# Patient Record
Sex: Female | Born: 1980 | Race: Black or African American | Hispanic: No | Marital: Single | Smoking: Never smoker
Health system: Southern US, Community
[De-identification: ages and names within clinical notes are randomized; demographics above are authoritative.]

## PROBLEM LIST (undated history)

## (undated) DIAGNOSIS — F419 Anxiety disorder, unspecified: Secondary | ICD-10-CM

## (undated) DIAGNOSIS — I10 Essential (primary) hypertension: Secondary | ICD-10-CM

---

## 2001-01-29 ENCOUNTER — Emergency Department (HOSPITAL_COMMUNITY): Admission: EM | Admit: 2001-01-29 | Discharge: 2001-01-29 | Payer: Self-pay | Admitting: *Deleted

## 2003-02-07 ENCOUNTER — Emergency Department (HOSPITAL_COMMUNITY): Admission: EM | Admit: 2003-02-07 | Discharge: 2003-02-07 | Payer: Self-pay | Admitting: Emergency Medicine

## 2003-02-08 ENCOUNTER — Emergency Department (HOSPITAL_COMMUNITY): Admission: EM | Admit: 2003-02-08 | Discharge: 2003-02-08 | Payer: Self-pay | Admitting: Emergency Medicine

## 2006-08-31 ENCOUNTER — Ambulatory Visit (HOSPITAL_COMMUNITY): Admission: RE | Admit: 2006-08-31 | Discharge: 2006-08-31 | Payer: Self-pay | Admitting: Obstetrics and Gynecology

## 2006-09-13 ENCOUNTER — Ambulatory Visit (HOSPITAL_COMMUNITY): Admission: RE | Admit: 2006-09-13 | Discharge: 2006-09-13 | Payer: Self-pay | Admitting: Gynecology

## 2007-02-01 ENCOUNTER — Encounter: Payer: Self-pay | Admitting: Obstetrics & Gynecology

## 2007-02-01 ENCOUNTER — Ambulatory Visit: Payer: Self-pay | Admitting: Obstetrics & Gynecology

## 2007-02-01 ENCOUNTER — Inpatient Hospital Stay (HOSPITAL_COMMUNITY): Admission: AD | Admit: 2007-02-01 | Discharge: 2007-02-04 | Payer: Self-pay | Admitting: Family Medicine

## 2008-12-06 IMAGING — US US OB FOLLOW-UP
1 series · 14 of 28 positions shown · non-contrast
Comparison: none

OBSTETRICAL ULTRASOUND:

 This ultrasound exam was performed in the [HOSPITAL] Ultrasound Department.  The OB US report was generated in the AS system, and faxed to the ordering physician.  This report is also available in [REDACTED] PACS.

[Series 1: us ob re-eval · 14 of 44 slices shown]
[im 2/44]
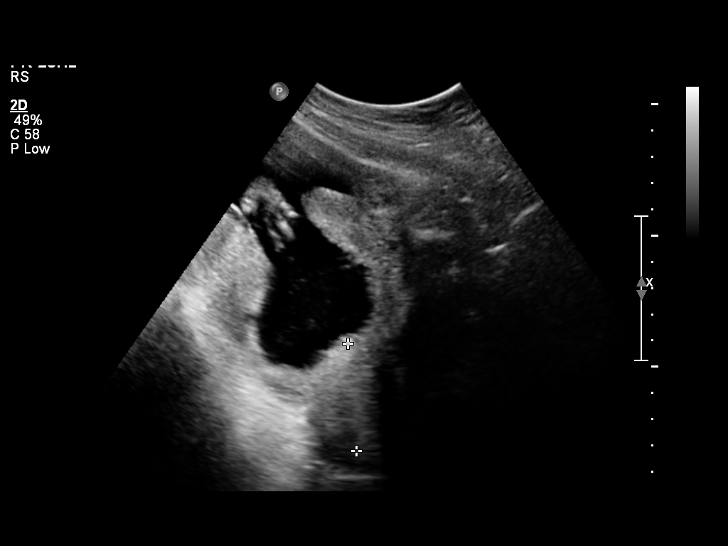
[im 5/44]
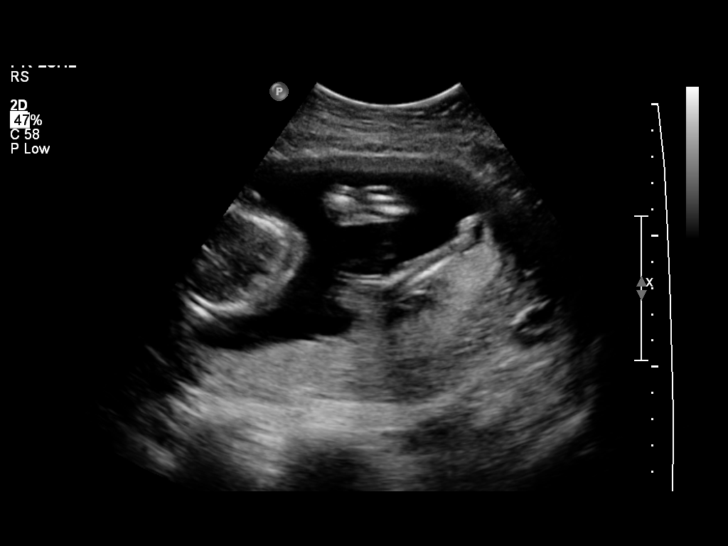
[im 8/44]
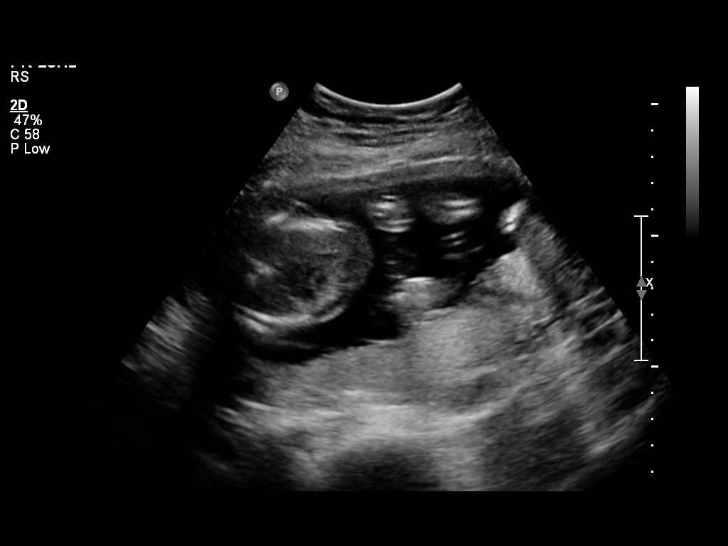
[im 12/44]
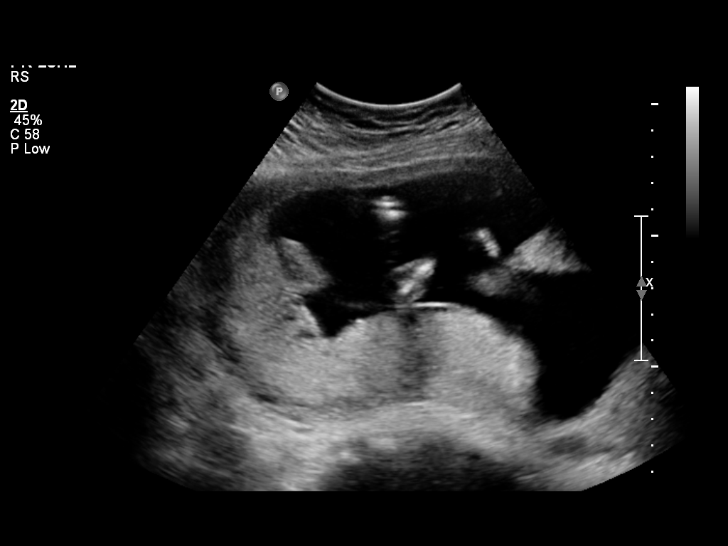
[im 15/44]
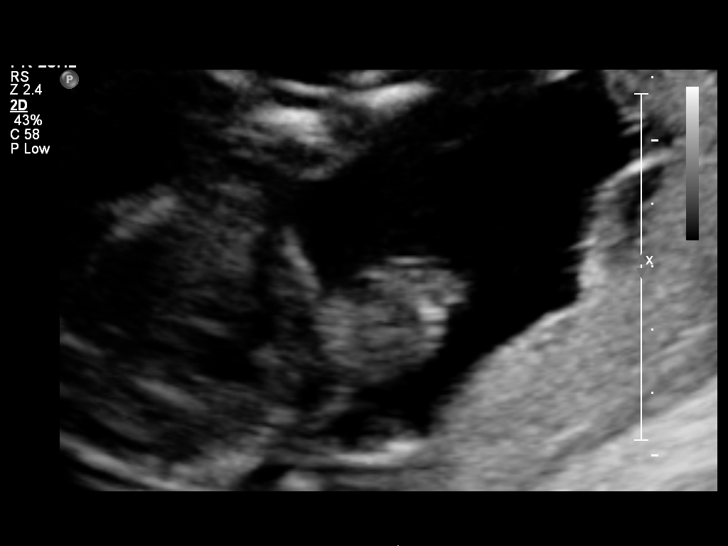
[im 18/44]
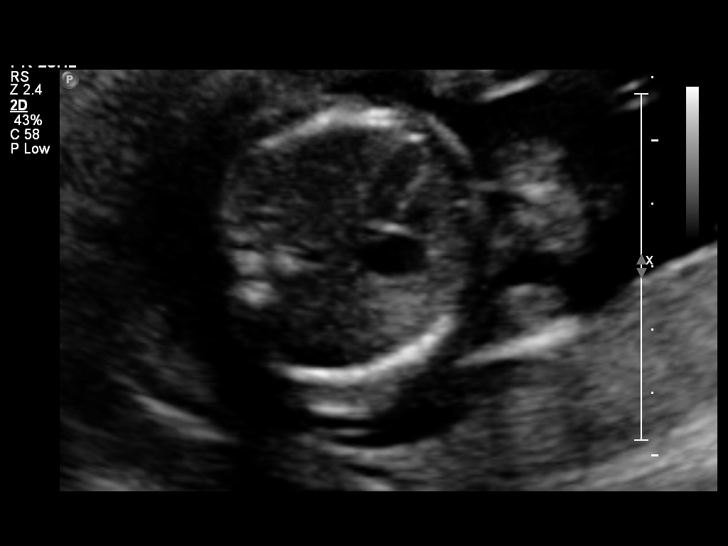
[im 21/44]
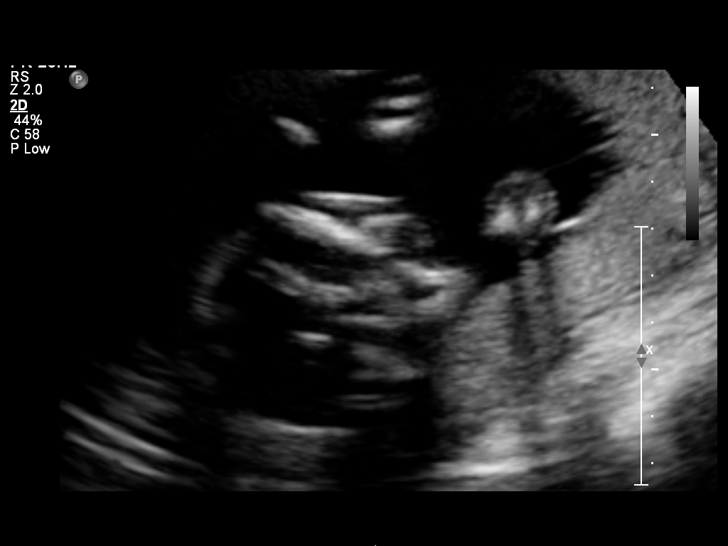
[im 24/44]
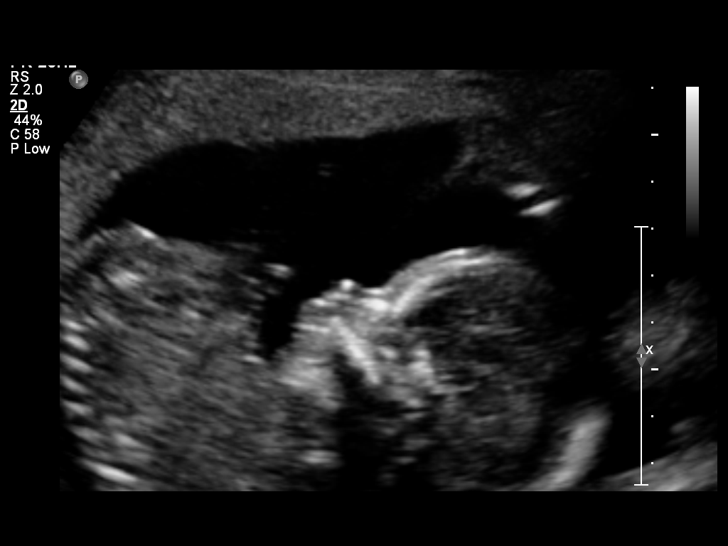
[im 28/44]
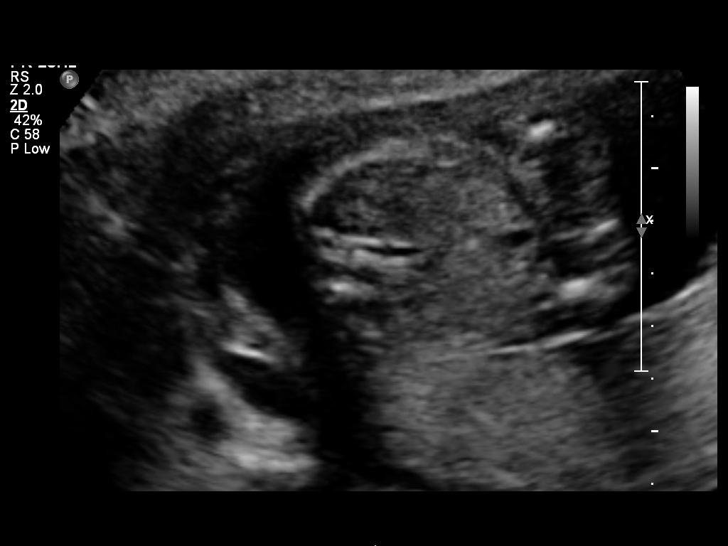
[im 31/44]
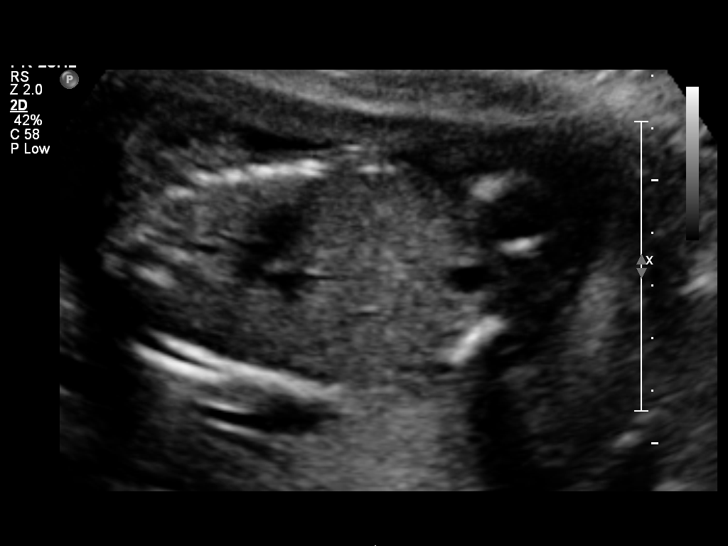
[im 34/44]
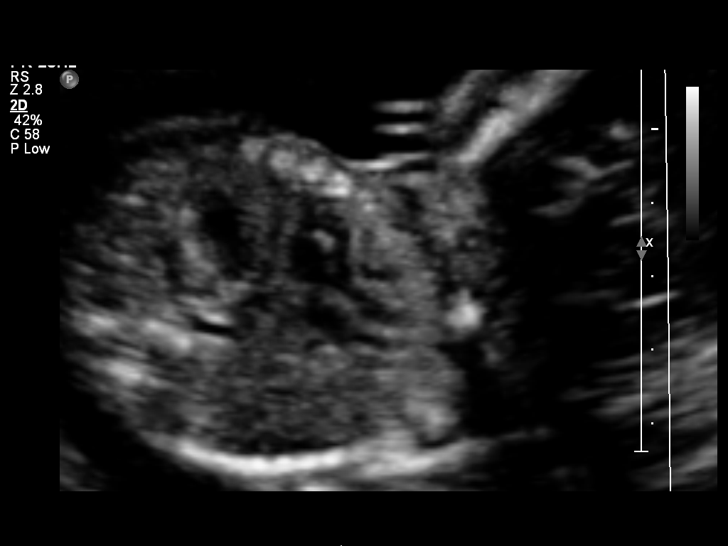
[im 37/44]
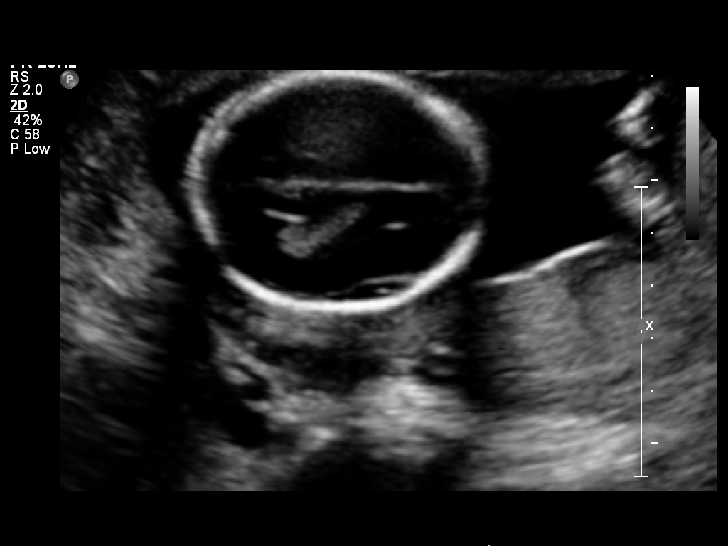
[im 40/44]
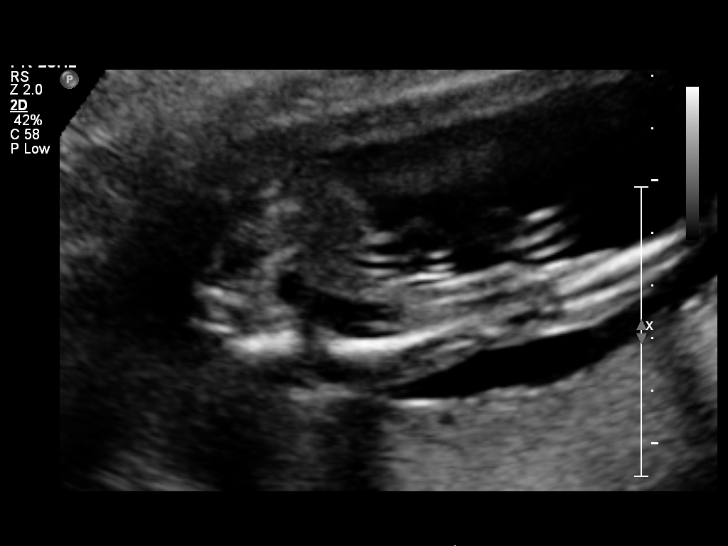
[im 44/44]
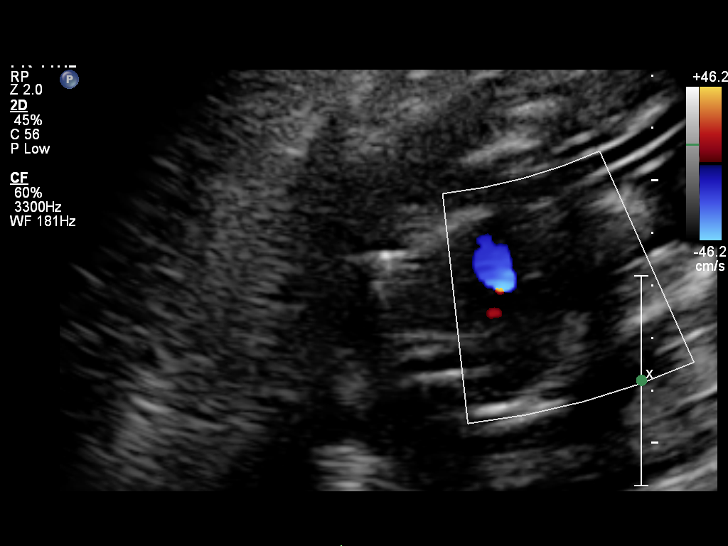

[14 of 28 positions shown; findings below may reference images not displayed]

IMPRESSION: See AS Obstetric US report.

## 2009-04-26 IMAGING — US US FETAL BPP W/O NONSTRESS
1 series · 13 of 13 positions shown · non-contrast
Comparison: none

OBSTETRICAL ULTRASOUND:

 This ultrasound exam was performed in the [HOSPITAL] Ultrasound Department.  The OB US report was generated in the AS system, and faxed to the ordering physician.  This report is also available in [REDACTED] PACS.

[Series 1: us fetal bpp w/o nonstress · 0.38mm/px · 13 of 13 slices shown]
[im 1/13]
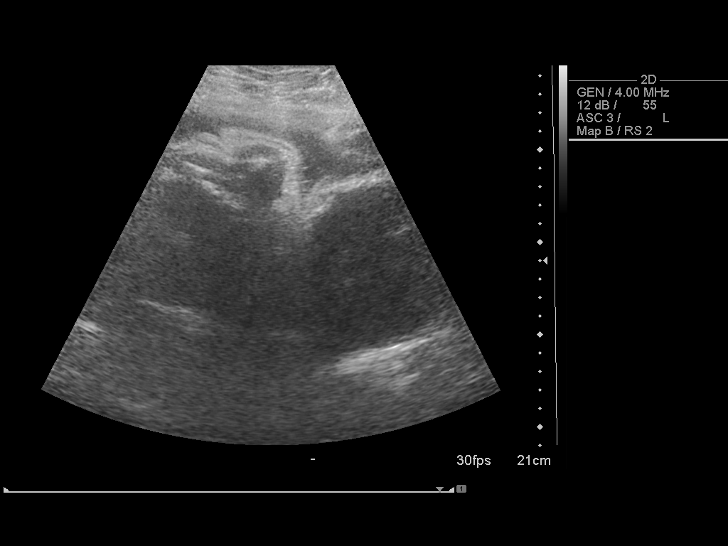
[im 2/13]
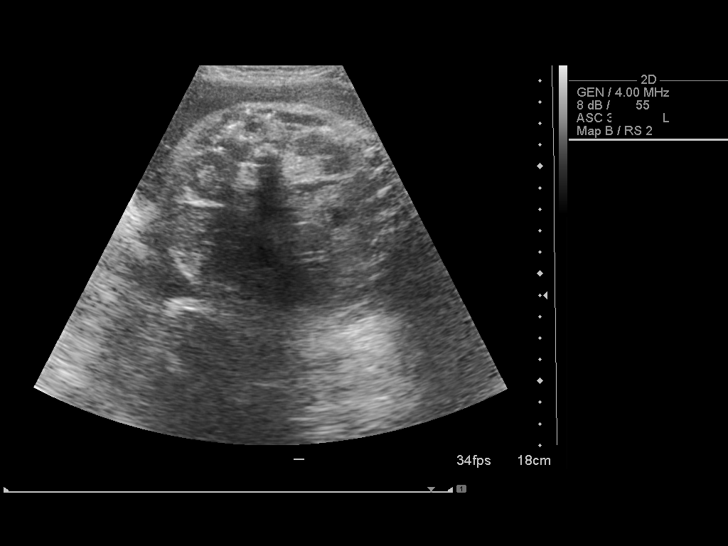
[im 3/13]
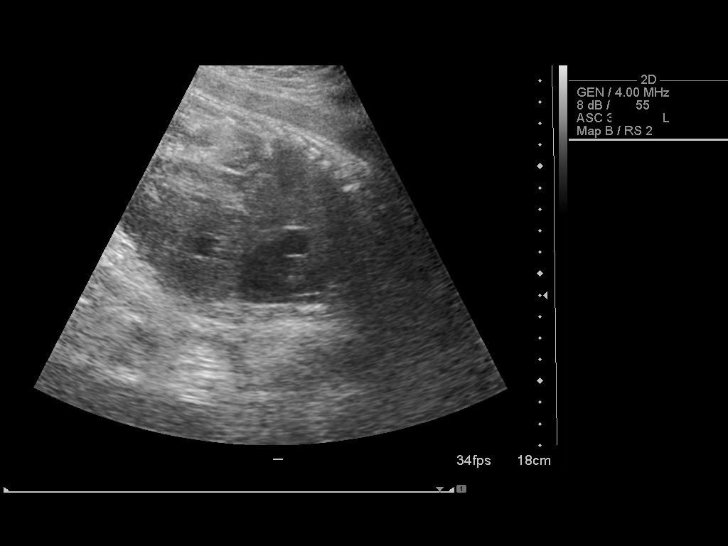
[im 4/13]
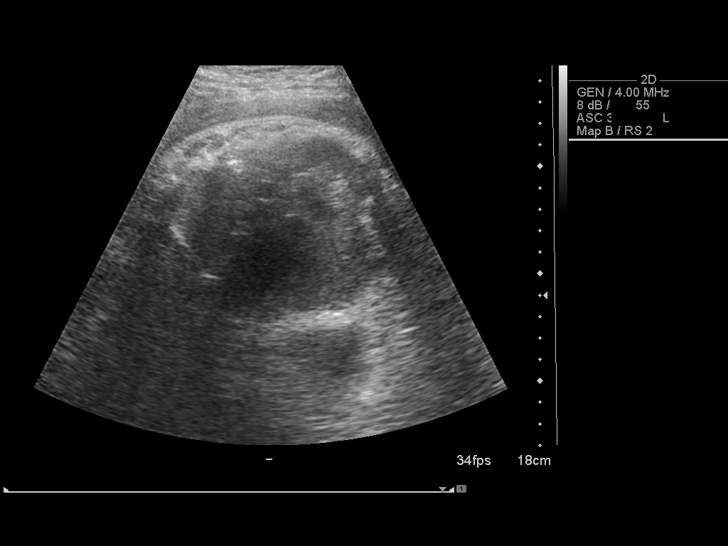
[im 5/13]
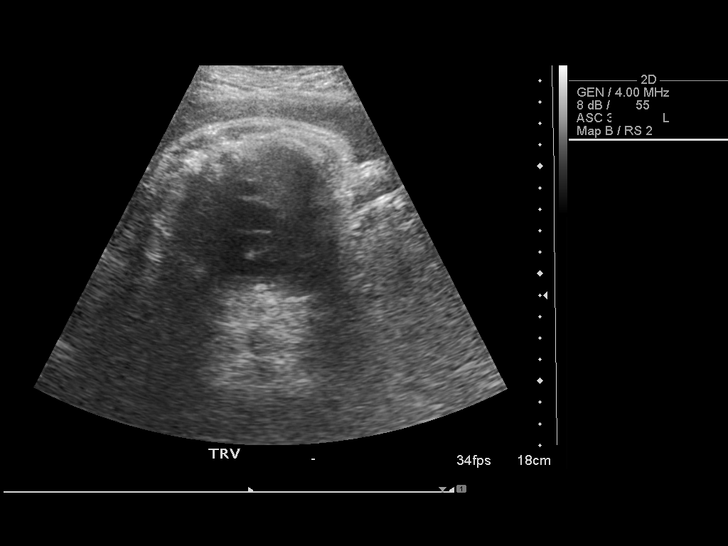
[im 6/13]
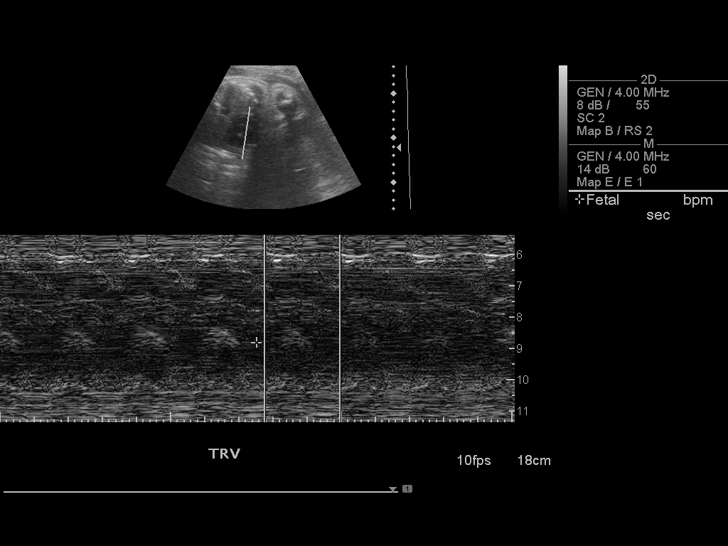
[im 7/13]
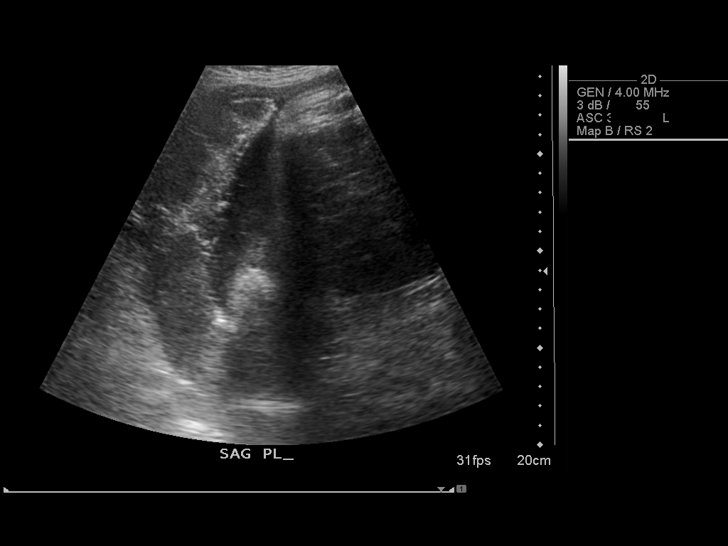
[im 8/13]
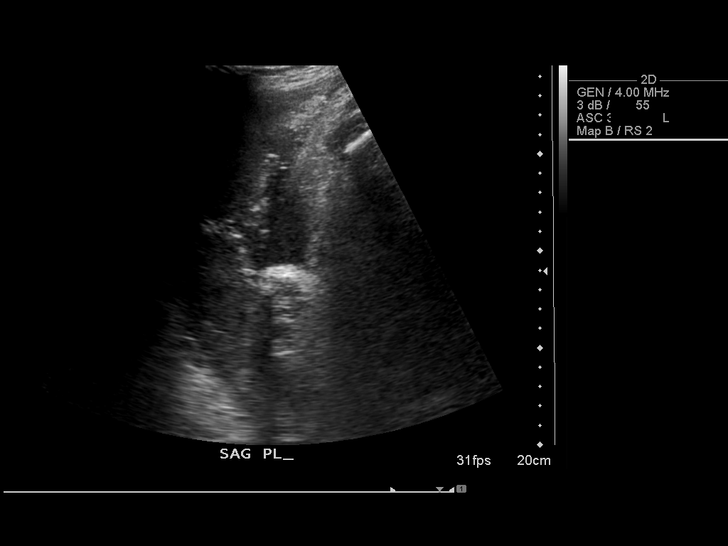
[im 9/13]
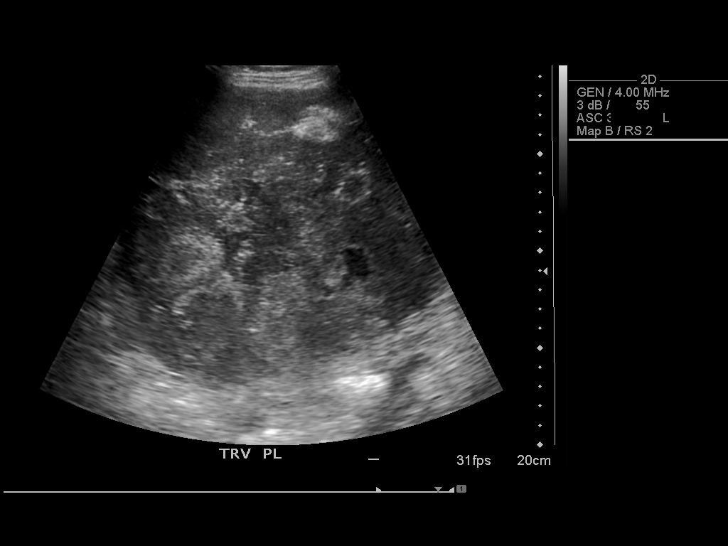
[im 10/13]
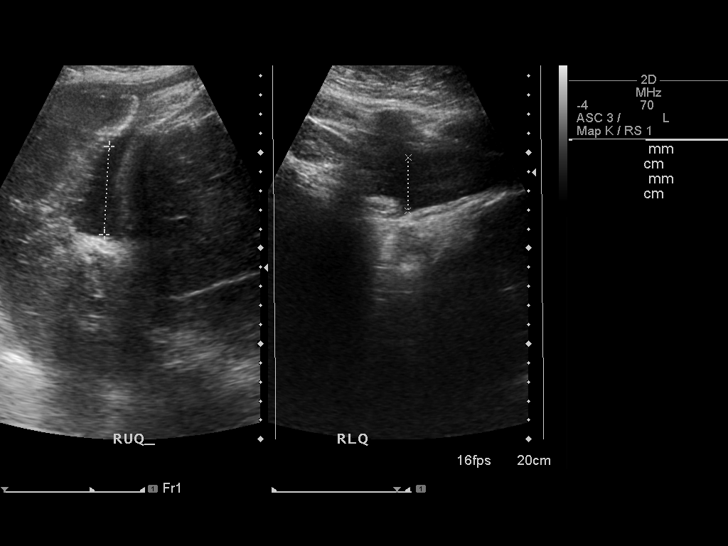
[im 11/13]
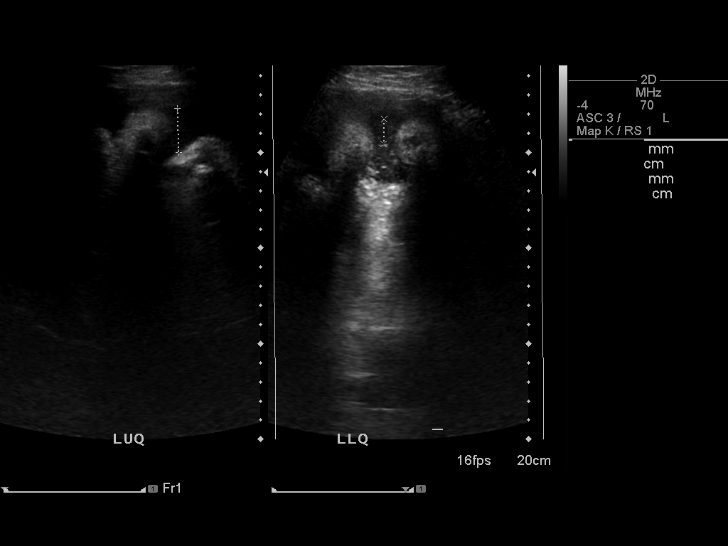
[im 12/13]
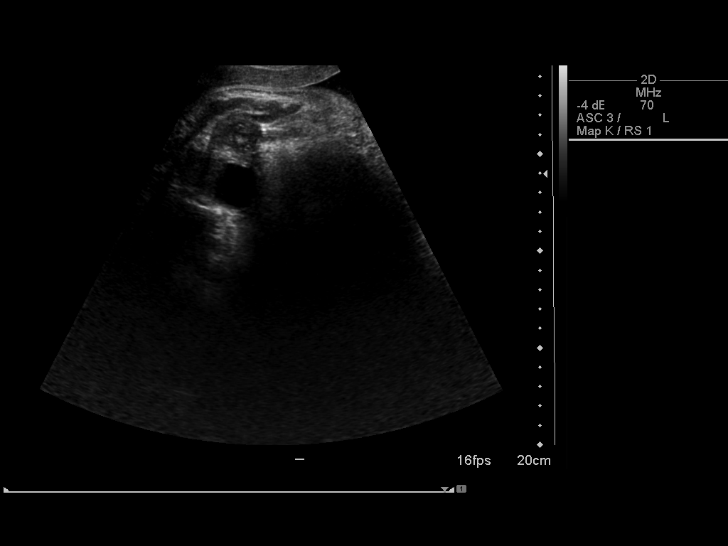
[im 13/13]
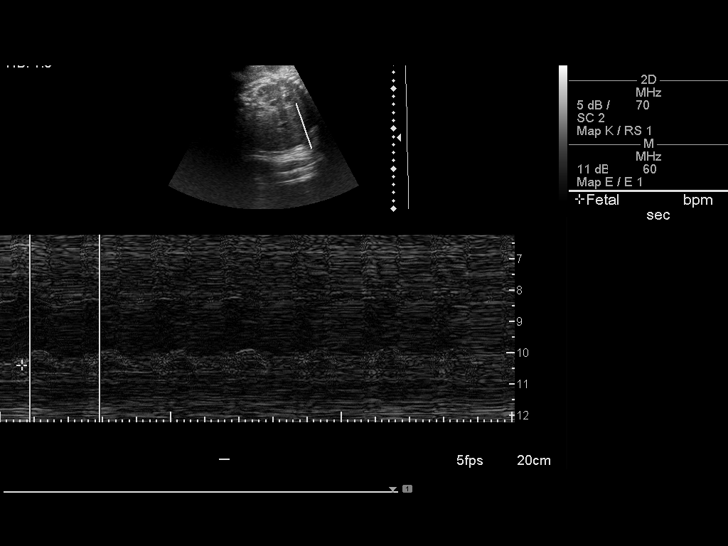

[13 of 13 positions shown; findings below may reference images not displayed]

IMPRESSION: See AS Obstetric US report.

## 2010-05-24 NOTE — Op Note (Signed)
Tina Mccarthy, Tina Mccarthy              ACCOUNT NO.:  [id number]   MEDICAL RECORD NO.:  [id number]          PATIENT TYPE:  INP   LOCATION:  9126                          FACILITY:  [facility]   PHYSICIAN:  Lesly Dukes, M.D. DATE OF BIRTH:  1980-08-30   DATE OF PROCEDURE:  02/01/2007  DATE OF DISCHARGE:                               OPERATIVE REPORT   PREOPERATIVE DIAGNOSES:  1. Intrauterine pregnancy at 40 weeks 2 days gestation.  2. Nonreassuring fetal heart tones.  3. Biophysical profile 2/10.  4. GBS negative.   POSTOPERATIVE DIAGNOSES:  1. Intrauterine pregnancy at 40 weeks 2 days.  2. Nonreassuring fetal heart tones.  3. Meconium stained amniotic fluid.  4. Biophysical profile 2/10.  5. GBS negative.   PROCEDURE:  Primary low transverse cesarean section.   SURGEON:  Lesly Dukes, MD   ASSISTANT:  Karlton Lemon, MD   ANESTHESIA:  Spinal.   FINDINGS:  1. Viable infant female weighing 7 pounds 14 ounces with Apgars of 9 at      one minute and 9 at five minutes with a cord pH of 7.26.  2. Meconium stained amniotic fluid.  3. Subserosal uterine fibroids with otherwise normal female pelvic      anatomy.   ESTIMATED BLOOD LOSS:  Four hundred mL.   DRAINS:  Foley with 210 mL of clear yellow urine.   COMPLICATIONS:  None immediate.   SPECIMENS:  Placenta to pathology.  Cord pH and cord gas to the lab.   INDICATIONS FOR PROCEDURE:  Tina Mccarthy is a 30 year old gravida 1, para  zero that presented to the maternity admission unit complaining of  contractions.  She was found to have a nonreactive NST (nonstress test)  and was sent for a biophysical profile.  The biophysical profile showed  a score of 2/10 with no fetal tone, fetal movement, fetal breathing  noted.  Her amniotic fluid was subjectively normal.  She was then noted  to have nonreassuring fetal heart tracings in the form of repetitive  variable decelerations.   DESCRIPTION OF PROCEDURE:  The patient was taken to  the operating room  and after obtaining adequate spinal anesthesia was prepped and draped in  the usual sterile manner in the supine position with left lateral  uterine displacement.  After ensuring adequate anesthesia a Pfannenstiel  skin incision was made using a scalpel.  This incision was carried down  through the subcutaneous tissues using the scalpel.  The rectus fascia  was nicked in the midline and the incision was extended laterally in  each direction using the Mayo scissors.  The rectus muscle was dissected  free of the fascia using both sharp and blunt dissection.  The rectus  muscles were separated bluntly.  The parietoperitoneum was identified  and separated bluntly under direct visualization.  Bladder blade was  then placed and reflection of the visceroperitoneum superior to the  bladder was identified and elevated and incised using Metzenbaum  scissors.  The incision was then extended laterally and a bladder flap  was created using blunt dissection and retracted with the bladder blade.  A  low transverse uterine incision was made using the scalpel.  This  incision was extended laterally and superiorly using blunt dissection.  Meconium stained amniotic fluid was noted with rupture of membranes.  A  hand was placed within the uterine cavity and used to elevate and flex  the head of the infant which was delivered without difficulty.  The  infant was bulb suctioned after delivery of the head.  The body of the  infant was then delivered without difficulty.  There was a body cord  noted.  The cord of the infant was then doubly clamped and cut and the  infant handed to the nursing in attendance.  Specimens were collected  for cord blood and cord pH.  Cord pH returned 7.26.  The placenta was  delivered with uterine massage and cord traction and appeared intact.  The endometrial cavity was then wiped free using dry laparotomy sponges.  The edges of the uterine incision were clamped  with ring clamps and the  uterine incision was closed in 2 layers, the first being a running  locked stitch of 0 Vicryl and the second being a running imbricating  stitch of 0 Vicryl.  The uterine incision was inspected and found to  have adequate hemostasis.  The operative site was then irrigated with  copious amounts of normal saline.  The uterine incision was inspected  once more and found to have adequate hemostasis.  The rectus muscles  were inspected and small areas of bleeding controlled using Bovie  cautery.  The rectus fascia was reapproximated with one suture of 0  Vicryl.  Small areas of bleeding in the subcutaneous tissue were  controlled with Bovie cautery.  The skin was reapproximated with  staples.  Sponge, instrument, and needle counts were correct x2.  The  patient tolerated the procedure well and went to the post anesthesia  recovery unit in stable condition.      Karlton Lemon, MD      Lesly Dukes, M.D.  Electronically Signed    NS/MEDQ  D:  02/01/2007  T:  02/02/2007  Job:  [id number]

## 2010-05-27 NOTE — Discharge Summary (Signed)
Tina Mccarthy, Tina Mccarthy              ACCOUNT NO.:  [id number]   MEDICAL RECORD NO.:  [id number]          PATIENT TYPE:  INP   LOCATION:  9126                          [facility]ACILITY:  [facility]   PHYSICIAN:  Lesly Dukes, M.D. DATE OF BIRTH:  [birth year]   DATE OF ADMISSION:  02/01/2007  DATE OF DISCHARGE:  02/04/2007                               DISCHARGE SUMMARY   ATTENDING PHYSICIAN:  Attending at time of discharge Dr. Elsie Lincoln.   REASON FOR ADMISSION:  Onset of labor.   HOSPITAL COURSE:  This is a 30 year old G1, P1-0-0-1 status post primary  low transverse Cesarean section for non reassuring fetal heart tones.  She delivered a viable female, weight 7 pounds, 14 ounces, Apgar's 9 at 1  minute and 9 at 5 minutes.  Cord pH was 7.26.  Fluid was meconium  stained at the time of delivery.  She had three vessel cord placenta  that was delivered manually.  Estimated blood loss for her procedure was  400 cc.  For further details of her procedure, please see dictated  operative note.  Postpartum she had no complications.   DISCHARGE DIAGNOSIS:  Term pregnancy, delivered.   DISCHARGE INFORMATION/INSTRUCTIONS:  The patient plans to both breast  and bottle feed.  Baby was, in fact, marijuana positive and therefore  the baby will have to have bottle feeding for some time.  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   ACTIVITY:  She is to do no heavy lifting for six weeks and nothing in  the vagina for six weeks.   DIET:  Routine.   CONDITION ON DISCHARGE:  Well.   MATERNAL LABORATORY DATA:  She is O positive, Rubella immune, hepatitis  B negative.  HIV negative.  Group B Strep negative.  Hemoglobin on  February 02, 2007 was 10.8.   DISCHARGE MEDICATIONS:  1. Percocet 5/325 one to two tablets p.o. q.4-6h. p.r.n. pain.  2. Motrin 600 mg one tablet p.o. q.6h. p.r.n. pain.  3. Colace 100 mg one tab p.o. b.i.d. p.r.n. constipation.  4. Prenatal vitamins one tablet  p.o. daily.   FOLLOWUP:  Followup is to be in six weeks at [facility]  Department.      Ancil Boozer, MD      Lesly Dukes, M.D.  Electronically Signed    SA/MEDQ  D:  02/11/2007  T:  02/12/2007  Job:  [id number]

## 2010-09-30 LAB — CBC
HCT: 36.8
Hemoglobin: 12.5
MCHC: 33.4
MCHC: 34
MCV: 95.7
MCV: 95.9
RBC: 3.39 — ABNORMAL LOW
RDW: 13.5
RDW: 13.6

## 2016-01-31 ENCOUNTER — Ambulatory Visit: Payer: Self-pay

## 2016-02-01 ENCOUNTER — Ambulatory Visit (INDEPENDENT_AMBULATORY_CARE_PROVIDER_SITE_OTHER): Payer: 59 | Admitting: Family Medicine

## 2016-02-01 ENCOUNTER — Encounter (INDEPENDENT_AMBULATORY_CARE_PROVIDER_SITE_OTHER): Payer: Self-pay

## 2016-02-01 VITALS — BP 122/72 | HR 86 | Temp 98.1°F | Resp 17 | Ht 66.5 in | Wt 219.0 lb

## 2016-02-01 DIAGNOSIS — Z01419 Encounter for gynecological examination (general) (routine) without abnormal findings: Secondary | ICD-10-CM | POA: Diagnosis not present

## 2016-02-01 DIAGNOSIS — K219 Gastro-esophageal reflux disease without esophagitis: Secondary | ICD-10-CM

## 2016-02-01 DIAGNOSIS — Z130 Encounter for screening for diseases of the blood and blood-forming organs and certain disorders involving the immune mechanism: Secondary | ICD-10-CM

## 2016-02-01 DIAGNOSIS — Z13220 Encounter for screening for lipoid disorders: Secondary | ICD-10-CM

## 2016-02-01 DIAGNOSIS — Z131 Encounter for screening for diabetes mellitus: Secondary | ICD-10-CM | POA: Diagnosis not present

## 2016-02-01 DIAGNOSIS — Z0000 Encounter for general adult medical examination without abnormal findings: Secondary | ICD-10-CM | POA: Diagnosis not present

## 2016-02-01 DIAGNOSIS — Z1329 Encounter for screening for other suspected endocrine disorder: Secondary | ICD-10-CM | POA: Diagnosis not present

## 2016-02-01 DIAGNOSIS — H029 Unspecified disorder of eyelid: Secondary | ICD-10-CM | POA: Diagnosis not present

## 2016-02-01 LAB — POCT URINALYSIS DIP (MANUAL ENTRY)
Bilirubin, UA: NEGATIVE
GLUCOSE UA: NEGATIVE
Ketones, POC UA: NEGATIVE
LEUKOCYTES UA: NEGATIVE
NITRITE UA: NEGATIVE
PROTEIN UA: NEGATIVE
Spec Grav, UA: 1.015
UROBILINOGEN UA: 0.2
pH, UA: 7

## 2016-02-01 LAB — POC MICROSCOPIC URINALYSIS (UMFC): MUCUS RE: ABSENT

## 2016-02-01 LAB — POCT URINE PREGNANCY: Preg Test, Ur: NEGATIVE

## 2016-02-01 MED ORDER — NORETHINDRON-ETHINYL ESTRAD-FE 1-20/1-30/1-35 MG-MCG PO TABS: 1.0000 | ORAL_TABLET | Freq: Every day | ORAL | 12 refills | Status: AC

## 2016-02-01 MED ORDER — OMEPRAZOLE 20 MG PO CPDR: 20.0000 mg | DELAYED_RELEASE_CAPSULE | Freq: Every day | ORAL | 2 refills | Status: AC

## 2016-02-01 NOTE — Progress Notes (Signed)
Zabdi Mis  MRN: [id number] DOB: [birth year]  Subjective:  Tina Mccarthy is a 36 y.o. female who presents for annual physical exam and routine gynecological exam. 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  Last dental exam: December 2017 Last vision exam: No recent visit Last pap smear: 3 years ago had an abnormal pap. Sexual activity: Last encounter in May 2017 and declines STD testing. Vaccinations-current Tetanus obtained in 2010.  Patient reports the following concerns: Improving diet. As she reports regular intake of fast foods. Notices abdominal bloating and gas regularly and experiences frequent indigestion (burning in chest).   Patient has had what appears to be a mole on her right eye that has increased in diameter. Although, the mole hasn't impacted her vision currently, she is concerned that her view maybe obstructed if the moles increases in size.   Patient requests, opthalmology referral.  Social History   Social History  . Marital status: Single    Spouse name: N/A  . Number of children: N/A  . Years of education: N/A   Social History Main Topics  . Smoking status: Never Smoker  . Smokeless tobacco: Never Used  . Alcohol use No  . Drug use: Yes    Types: Marijuana  . Sexual activity: No   Other Topics Concern  . None   Social History Narrative  . None      Review of Systems  Objective:  BP 122/72 (BP Location: Right Arm, Patient Position: Sitting, Cuff Size: Normal)   Pulse 86   Temp 98.1 F (36.7 C) (Oral)   Resp 17   Ht 5' 6.5" (1.689 m)   Wt 219 lb (99.3 kg)   LMP 01/30/2016 (Approximate)   SpO2 97%   BMI 34.82 kg/m   Physical Exam  Constitutional: She is oriented to person, place, and time and well-developed, well-nourished, and in no distress.  HENT:  Head: Normocephalic and atraumatic.  Right Ear: External ear normal.  Left Ear: External ear normal.  Nose: Nose normal.  Mouth/Throat: Oropharynx is clear and  moist.  Eyes: Conjunctivae and EOM are normal. Pupils are equal, round, and reactive to light.  Neck: Normal range of motion. Neck supple. No thyromegaly present.  Cardiovascular: Normal rate, regular rhythm, normal heart sounds and intact distal pulses.   Pulmonary/Chest: Effort normal and breath sounds normal.  Abdominal: Soft. Bowel sounds are normal.  Genitourinary: Uterus normal, cervix normal, right adnexa normal and left adnexa normal. No vaginal discharge found.  Musculoskeletal: Normal range of motion.  Lymphadenopathy:    She has no cervical adenopathy.  Neurological: She is alert and oriented to person, place, and time. Gait normal. GCS score is 15.  Skin: Skin is warm and dry.  Psychiatric: Mood, memory, affect and judgment normal.    Visual Acuity Screening   Right eye Left eye Both eyes  Without correction: 20/20 20/20 20/15  With correction:       Assessment and Plan :  Discussed healthy lifestyle, diet, exercise, preventative care, vaccinations, and addressed patient's concerns. Plan for follow up in.  Otherwise, plan for specific conditions below.  Plan:  1. Encounter for annual routine gynecological examination - POCT Microscopic Urinalysis (UMFC) - POCT urine pregnancy - POCT urinalysis dipstick - Pap IG, CT/NG NAA, and HPV (high risk)  2. Physical exam, annual Age-appropriate anticipatory guidance provided.  3. Screening for diabetes mellitus - CMP14+EGFR  4. Screening for deficiency anemia - CBC with Differential  5. Screening, lipid - Lipid  panel  6. Screening for thyroid disorder - Thyroid Panel With TSH  7. Gastroesophageal reflux disease without esophagitis  8. Eyelid lesion - Ambulatory referral to Ophthalmology  You will be notified of your lab results via [place].  Carroll Sage. Kenton Kingfisher, MSN, FNP-C [facility] [place]

## 2016-02-01 NOTE — Patient Instructions (Addendum)
Acid Reflux Symptoms-Start 20 mg of omeprazole at bedtime nightly for symptom management.  Birth Control prescription has been submitted.  Please sign up for MyChart and your lab results will be posted. Otherwise you will be notified via mail of all normal results. If any results are abnormal, you will receive a phone call.    IF you received an x-ray today, you will receive an invoice from [facility]. Please contact [facility] at [phone] with questions or concerns regarding your invoice.   IF you received labwork today, you will receive an invoice from [place]. Please contact [facility] at [phone] with questions or concerns regarding your invoice.   Our billing staff will not be able to assist you with questions regarding bills from these companies.  You will be contacted with the lab results as soon as they are available. The fastest way to get your results is to activate your My Chart account. Instructions are located on the last page of this paperwork. If you have not heard from [place] regarding the results in 2 weeks, please contact this office.     Exercising to Stay Healthy Introduction Exercising regularly is important. It has many health benefits, such as:  Improving your overall fitness, flexibility, and endurance.  Increasing your bone density.  Helping with weight control.  Decreasing your body fat.  Increasing your muscle strength.  Reducing stress and tension.  Improving your overall health. In order to become healthy and stay healthy, it is recommended that you do moderate-intensity and vigorous-intensity exercise. You can tell that you are exercising at a moderate intensity if you have a higher heart rate and faster breathing, but you are still able to hold a conversation. You can tell that you are exercising at a vigorous intensity if you are breathing much harder and faster and cannot hold a conversation while exercising. How often  should I exercise? Choose an activity that you enjoy and set realistic goals. Your health care provider can help you to make an activity plan that works for you. Exercise regularly as directed by your health care provider. This may include:  Doing resistance training twice each week, such as:  Push-ups.  Sit-ups.  Lifting weights.  Using resistance bands.  Doing a given intensity of exercise for a given amount of time. Choose from these options:  150 minutes of moderate-intensity exercise every week.  75 minutes of vigorous-intensity exercise every week.  A mix of moderate-intensity and vigorous-intensity exercise every week. Children, pregnant women, people who are out of shape, people who are overweight, and older adults may need to consult a health care provider for individual recommendations. If you have any sort of medical condition, be sure to consult your health care provider before starting a new exercise program. What are some exercise ideas? Some moderate-intensity exercise ideas include:  Walking at a rate of 1 mile in 15 minutes.  Biking.  Hiking.  Golfing.  Dancing. Some vigorous-intensity exercise ideas include:  Walking at a rate of at least 4.5 miles per hour.  Jogging or running at a rate of 5 miles per hour.  Biking at a rate of at least 10 miles per hour.  Lap swimming.  Roller-skating or in-line skating.  Cross-country skiing.  Vigorous competitive sports, such as football, basketball, and soccer.  Jumping rope.  Aerobic dancing. What are some everyday activities that can help me to get exercise?  Yard work, such as:  Child psychotherapist.  Raking and bagging leaves.  Washing  and waxing your car.  Pushing a stroller.  Shoveling snow.  Gardening.  Washing windows or floors. How can I be more active in my day-to-day activities?  Use the stairs instead of the elevator.  Take a walk during your lunch break.  If you drive, park  your car farther away from work or school.  If you take public transportation, get off one stop early and walk the rest of the way.  Make all of your phone calls while standing up and walking around.  Get up, stretch, and walk around every 30 minutes throughout the day. What guidelines should I follow while exercising?  Do not exercise so much that you hurt yourself, feel dizzy, or get very short of breath.  Consult your health care provider before starting a new exercise program.  Wear comfortable clothes and shoes with good support.  Drink plenty of water while you exercise to prevent dehydration or heat stroke. Body water is lost during exercise and must be replaced.  Work out until you breathe faster and your heart beats faster. This information is not intended to replace advice given to you by your health care provider. Make sure you discuss any questions you have with your health care provider. Document Released: 01/28/2010 Document Revised: 06/03/2015 Document Reviewed: 05/29/2013  2017 [employer]vier

## 2016-02-02 LAB — CMP14+EGFR
ALBUMIN: 4.2 g/dL (ref 3.5–5.5)
ALK PHOS: 57 IU/L (ref 39–117)
ALT: 18 IU/L (ref 0–32)
AST: 19 IU/L (ref 0–40)
Albumin/Globulin Ratio: 1.6 (ref 1.2–2.2)
BUN/Creatinine Ratio: 13 (ref 9–23)
BUN: 8 mg/dL (ref 6–20)
Bilirubin Total: 0.2 mg/dL (ref 0.0–1.2)
CALCIUM: 8.9 mg/dL (ref 8.7–10.2)
CO2: 23 mmol/L (ref 18–29)
CREATININE: 0.64 mg/dL (ref 0.57–1.00)
Chloride: 100 mmol/L (ref 96–106)
GFR calc Af Amer: 135 mL/min/{1.73_m2} (ref >59)
GFR, EST NON AFRICAN AMERICAN: 117 mL/min/{1.73_m2} (ref >59)
GLOBULIN, TOTAL: 2.6 g/dL (ref 1.5–4.5)
GLUCOSE: 94 mg/dL (ref 65–99)
Potassium: 5.1 mmol/L (ref 3.5–5.2)
Sodium: 137 mmol/L (ref 134–144)
Total Protein: 6.8 g/dL (ref 6.0–8.5)

## 2016-02-02 LAB — CBC WITH DIFFERENTIAL/PLATELET
BASOS: 0 %
Basophils Absolute: 0 10*3/uL (ref 0.0–0.2)
EOS (ABSOLUTE): 0.2 10*3/uL (ref 0.0–0.4)
EOS: 3 %
HEMATOCRIT: 41.6 % (ref 34.0–46.6)
HEMOGLOBIN: 13.5 g/dL (ref 11.1–15.9)
IMMATURE GRANS (ABS): 0.1 10*3/uL (ref 0.0–0.1)
IMMATURE GRANULOCYTES: 1 %
Lymphocytes Absolute: 3 10*3/uL (ref 0.7–3.1)
Lymphs: 35 %
MCH: 30.9 pg (ref 26.6–33.0)
MCHC: 32.5 g/dL (ref 31.5–35.7)
MCV: 95 fL (ref 79–97)
MONOCYTES: 7 %
Monocytes Absolute: 0.6 10*3/uL (ref 0.1–0.9)
NEUTROS PCT: 54 %
Neutrophils Absolute: 4.7 10*3/uL (ref 1.4–7.0)
Platelets: 494 10*3/uL — ABNORMAL HIGH (ref 150–379)
RBC: 4.37 x10E6/uL (ref 3.77–5.28)
RDW: 12.6 % (ref 12.3–15.4)
WBC: 8.7 10*3/uL (ref 3.4–10.8)

## 2016-02-02 LAB — LIPID PANEL
CHOL/HDL RATIO: 3.3 ratio (ref 0.0–4.4)
Cholesterol, Total: 190 mg/dL (ref 100–199)
HDL: 58 mg/dL (ref >39)
LDL CALC: 115 mg/dL — AB (ref 0–99)
TRIGLYCERIDES: 85 mg/dL (ref 0–149)
VLDL CHOLESTEROL CAL: 17 mg/dL (ref 5–40)

## 2016-02-02 LAB — THYROID PANEL WITH TSH
FREE THYROXINE INDEX: 2.3 (ref 1.2–4.9)
T3 Uptake Ratio: 27 % (ref 24–39)
T4, Total: 8.4 ug/dL (ref 4.5–12.0)
TSH: 0.98 u[IU]/mL (ref 0.450–4.500)

## 2016-02-03 LAB — PAP IG, CT-NG NAA, HPV HIGH-RISK
CHLAMYDIA, NUC. ACID AMP: NEGATIVE
GONOCOCCUS BY NUCLEIC ACID AMP: NEGATIVE
HPV, high-risk: NEGATIVE
PAP Smear Comment: 0

## 2016-03-03 ENCOUNTER — Other Ambulatory Visit: Payer: Self-pay | Admitting: Ophthalmology

## 2017-08-22 ENCOUNTER — Other Ambulatory Visit: Payer: Self-pay | Admitting: Family Medicine

## 2021-07-27 ENCOUNTER — Other Ambulatory Visit (HOSPITAL_COMMUNITY): Payer: Self-pay | Admitting: Orthopedic Surgery

## 2021-07-29 ENCOUNTER — Encounter (HOSPITAL_BASED_OUTPATIENT_CLINIC_OR_DEPARTMENT_OTHER): Payer: Self-pay | Admitting: Orthopedic Surgery

## 2021-08-03 NOTE — Progress Notes (Signed)
Called patient to complete pre-anesthesia phone call for her surgery scheduled 08/04/21 with Dr. Victorino Dike at [facility]. The center has attempted to get in touch with patient multiple times with no success. Called Carri from Dr. Angelica Pou office to touch base, awaiting call back.

## 2021-08-04 ENCOUNTER — Encounter (HOSPITAL_BASED_OUTPATIENT_CLINIC_OR_DEPARTMENT_OTHER): Admission: RE | Payer: Self-pay | Source: Home / Self Care

## 2021-08-04 ENCOUNTER — Ambulatory Visit (HOSPITAL_BASED_OUTPATIENT_CLINIC_OR_DEPARTMENT_OTHER)
Admission: RE | Admit: 2021-08-04 | Payer: BC Managed Care – PPO | Source: Home / Self Care | Admitting: Orthopedic Surgery

## 2021-08-04 HISTORY — DX: Essential (primary) hypertension: I10

## 2021-08-04 HISTORY — DX: Anxiety disorder, unspecified: F41.9

## 2021-08-04 SURGERY — OPEN REDUCTION INTERNAL FIXATION (ORIF) ANKLE FRACTURE
Anesthesia: General | Site: Foot | Laterality: Right
